# Patient Record
Sex: Female | Born: 1985 | Race: White | Hispanic: No | Marital: Single | Smoking: Current every day smoker
Health system: Southern US, Community
[De-identification: ages and names within clinical notes are randomized; demographics above are authoritative.]

## PROBLEM LIST (undated history)

## (undated) DIAGNOSIS — R011 Cardiac murmur, unspecified: Secondary | ICD-10-CM

## (undated) DIAGNOSIS — F319 Bipolar disorder, unspecified: Secondary | ICD-10-CM

## (undated) DIAGNOSIS — F988 Other specified behavioral and emotional disorders with onset usually occurring in childhood and adolescence: Secondary | ICD-10-CM

## (undated) DIAGNOSIS — F419 Anxiety disorder, unspecified: Secondary | ICD-10-CM

## (undated) HISTORY — PX: MANDIBLE FRACTURE SURGERY: SHX706

## (undated) HISTORY — PX: ABDOMINAL SURGERY: SHX537

---
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
## 2005-03-31 ENCOUNTER — Observation Stay: Payer: Self-pay

## 2005-05-24 ENCOUNTER — Observation Stay: Payer: Self-pay | Admitting: Obstetrics & Gynecology

## 2005-07-10 ENCOUNTER — Inpatient Hospital Stay: Payer: Self-pay

## 2006-01-30 ENCOUNTER — Emergency Department: Payer: Self-pay | Admitting: Emergency Medicine

## 2006-07-01 ENCOUNTER — Emergency Department: Payer: Self-pay | Admitting: Emergency Medicine

## 2007-01-28 ENCOUNTER — Emergency Department: Payer: Self-pay | Admitting: Unknown Physician Specialty

## 2007-05-23 ENCOUNTER — Emergency Department: Payer: Self-pay | Admitting: Emergency Medicine

## 2007-07-14 ENCOUNTER — Inpatient Hospital Stay: Payer: Self-pay | Admitting: Unknown Physician Specialty

## 2007-10-29 ENCOUNTER — Emergency Department: Payer: Self-pay | Admitting: Emergency Medicine

## 2008-09-17 ENCOUNTER — Inpatient Hospital Stay: Payer: Self-pay | Admitting: Psychiatry

## 2008-09-17 ENCOUNTER — Ambulatory Visit: Payer: Self-pay | Admitting: Cardiovascular Disease

## 2009-05-09 ENCOUNTER — Emergency Department: Payer: Self-pay | Admitting: Emergency Medicine

## 2009-08-01 ENCOUNTER — Emergency Department: Payer: Self-pay | Admitting: Emergency Medicine

## 2009-12-26 ENCOUNTER — Ambulatory Visit: Payer: Self-pay | Admitting: Family Medicine

## 2009-12-29 ENCOUNTER — Ambulatory Visit: Payer: Self-pay | Admitting: Internal Medicine

## 2010-02-14 ENCOUNTER — Ambulatory Visit: Payer: Self-pay | Admitting: Internal Medicine

## 2010-04-28 ENCOUNTER — Ambulatory Visit: Payer: Self-pay | Admitting: Internal Medicine

## 2010-10-15 ENCOUNTER — Emergency Department: Payer: Self-pay | Admitting: Emergency Medicine

## 2010-12-11 ENCOUNTER — Ambulatory Visit: Payer: Self-pay | Admitting: Internal Medicine

## 2010-12-17 ENCOUNTER — Emergency Department: Payer: Self-pay | Admitting: Emergency Medicine

## 2010-12-28 ENCOUNTER — Ambulatory Visit: Payer: Self-pay | Admitting: Internal Medicine

## 2010-12-31 ENCOUNTER — Ambulatory Visit: Payer: Self-pay | Admitting: Family Medicine

## 2011-06-07 ENCOUNTER — Ambulatory Visit: Payer: Self-pay | Admitting: Obstetrics & Gynecology

## 2011-06-14 ENCOUNTER — Ambulatory Visit: Payer: Self-pay | Admitting: Obstetrics & Gynecology

## 2013-10-27 ENCOUNTER — Ambulatory Visit: Payer: Self-pay | Admitting: Family Medicine

## 2013-10-27 LAB — RAPID STREP-A WITH REFLX: MICRO TEXT REPORT: NEGATIVE

## 2013-10-30 LAB — BETA STREP CULTURE(ARMC)

## 2013-12-16 ENCOUNTER — Emergency Department: Payer: Self-pay | Admitting: Internal Medicine

## 2013-12-16 LAB — URINALYSIS, COMPLETE
BILIRUBIN, UR: NEGATIVE
Bacteria: NONE SEEN
Blood: NEGATIVE
GLUCOSE, UR: NEGATIVE mg/dL (ref 0–75)
Ketone: NEGATIVE
Leukocyte Esterase: NEGATIVE
NITRITE: NEGATIVE
Ph: 7 (ref 4.5–8.0)
Protein: NEGATIVE
RBC,UR: 1 /HPF (ref 0–5)
Specific Gravity: 1.004 (ref 1.003–1.030)
Squamous Epithelial: 1
WBC UR: 1 /HPF (ref 0–5)

## 2013-12-22 ENCOUNTER — Ambulatory Visit: Payer: Self-pay | Admitting: Family Medicine

## 2014-02-04 ENCOUNTER — Ambulatory Visit: Payer: Self-pay | Admitting: Emergency Medicine

## 2014-02-09 ENCOUNTER — Ambulatory Visit: Payer: Self-pay | Admitting: Family Medicine

## 2014-02-18 ENCOUNTER — Emergency Department: Payer: Self-pay | Admitting: Emergency Medicine

## 2014-03-14 ENCOUNTER — Emergency Department: Payer: Self-pay | Admitting: Emergency Medicine

## 2014-03-14 LAB — URINALYSIS, COMPLETE
BILIRUBIN, UR: NEGATIVE
Glucose,UR: NEGATIVE mg/dL (ref 0–75)
Ketone: NEGATIVE
NITRITE: POSITIVE
PH: 6 (ref 4.5–8.0)
Protein: NEGATIVE
RBC,UR: 2213 /HPF (ref 0–5)
SPECIFIC GRAVITY: 1.009 (ref 1.003–1.030)
Squamous Epithelial: 4

## 2014-04-07 ENCOUNTER — Emergency Department: Payer: Self-pay | Admitting: Internal Medicine

## 2014-04-07 LAB — URINALYSIS, COMPLETE
Bacteria: NONE SEEN
Bilirubin,UR: NEGATIVE
Glucose,UR: NEGATIVE mg/dL (ref 0–75)
Ketone: NEGATIVE
LEUKOCYTE ESTERASE: NEGATIVE
Nitrite: NEGATIVE
Ph: 7 (ref 4.5–8.0)
Protein: NEGATIVE
RBC,UR: 1 /HPF (ref 0–5)
Specific Gravity: 1.005 (ref 1.003–1.030)
Squamous Epithelial: 2
WBC UR: NONE SEEN /HPF (ref 0–5)

## 2014-04-07 LAB — GC/CHLAMYDIA PROBE AMP

## 2014-04-07 LAB — WET PREP, GENITAL

## 2014-04-07 LAB — PREGNANCY, URINE: Pregnancy Test, Urine: NEGATIVE m[IU]/mL

## 2014-05-08 ENCOUNTER — Emergency Department: Payer: Self-pay | Admitting: Emergency Medicine

## 2014-05-08 LAB — CBC WITH DIFFERENTIAL/PLATELET
Basophil #: 0 10*3/uL (ref 0.0–0.1)
Basophil %: 0.9 %
Eosinophil #: 0 10*3/uL (ref 0.0–0.7)
Eosinophil %: 0.7 %
HCT: 46.3 % (ref 35.0–47.0)
HGB: 15.1 g/dL (ref 12.0–16.0)
LYMPHS ABS: 0.9 10*3/uL — AB (ref 1.0–3.6)
LYMPHS PCT: 21.6 %
MCH: 31.1 pg (ref 26.0–34.0)
MCHC: 32.6 g/dL (ref 32.0–36.0)
MCV: 95 fL (ref 80–100)
Monocyte #: 0.4 x10 3/mm (ref 0.2–0.9)
Monocyte %: 9.8 %
Neutrophil #: 2.7 10*3/uL (ref 1.4–6.5)
Neutrophil %: 67 %
Platelet: 153 10*3/uL (ref 150–440)
RBC: 4.85 10*6/uL (ref 3.80–5.20)
RDW: 14.8 % — AB (ref 11.5–14.5)
WBC: 4 10*3/uL (ref 3.6–11.0)

## 2014-05-08 LAB — COMPREHENSIVE METABOLIC PANEL
ALBUMIN: 3.4 g/dL (ref 3.4–5.0)
ANION GAP: 7 (ref 7–16)
Alkaline Phosphatase: 99 U/L
BUN: 9 mg/dL (ref 7–18)
Bilirubin,Total: 0.3 mg/dL (ref 0.2–1.0)
Calcium, Total: 8.7 mg/dL (ref 8.5–10.1)
Chloride: 105 mmol/L (ref 98–107)
Co2: 28 mmol/L (ref 21–32)
Creatinine: 0.95 mg/dL (ref 0.60–1.30)
EGFR (Non-African Amer.): >60
GLUCOSE: 99 mg/dL (ref 65–99)
OSMOLALITY: 278 (ref 275–301)
Potassium: 3.8 mmol/L (ref 3.5–5.1)
SGOT(AST): 34 U/L (ref 15–37)
SGPT (ALT): 35 U/L
SODIUM: 140 mmol/L (ref 136–145)
Total Protein: 7.3 g/dL (ref 6.4–8.2)

## 2014-05-08 LAB — URINALYSIS, COMPLETE
Bacteria: NONE SEEN
Bilirubin,UR: NEGATIVE
Blood: NEGATIVE
Glucose,UR: NEGATIVE mg/dL (ref 0–75)
KETONE: NEGATIVE
Leukocyte Esterase: NEGATIVE
NITRITE: NEGATIVE
Ph: 6 (ref 4.5–8.0)
Protein: NEGATIVE
Specific Gravity: 1.005 (ref 1.003–1.030)
Squamous Epithelial: 3
WBC UR: 3 /HPF (ref 0–5)

## 2014-05-08 LAB — PREGNANCY, URINE: Pregnancy Test, Urine: NEGATIVE m[IU]/mL

## 2014-05-10 LAB — URINE CULTURE

## 2015-03-03 ENCOUNTER — Ambulatory Visit
Admission: EM | Admit: 2015-03-03 | Discharge: 2015-03-03 | Disposition: A | Discharge status: Home / Self Care | Payer: Medicaid Other | Attending: Emergency Medicine | Admitting: Emergency Medicine

## 2015-03-03 DIAGNOSIS — F319 Bipolar disorder, unspecified: Secondary | ICD-10-CM | POA: Insufficient documentation

## 2015-03-03 DIAGNOSIS — R030 Elevated blood-pressure reading, without diagnosis of hypertension: Secondary | ICD-10-CM | POA: Diagnosis not present

## 2015-03-03 DIAGNOSIS — Z79899 Other long term (current) drug therapy: Secondary | ICD-10-CM | POA: Diagnosis not present

## 2015-03-03 DIAGNOSIS — Z87891 Personal history of nicotine dependence: Secondary | ICD-10-CM | POA: Diagnosis not present

## 2015-03-03 DIAGNOSIS — M25512 Pain in left shoulder: Secondary | ICD-10-CM | POA: Diagnosis not present

## 2015-03-03 DIAGNOSIS — F419 Anxiety disorder, unspecified: Secondary | ICD-10-CM | POA: Diagnosis not present

## 2015-03-03 DIAGNOSIS — M542 Cervicalgia: Secondary | ICD-10-CM | POA: Diagnosis not present

## 2015-03-03 DIAGNOSIS — M5412 Radiculopathy, cervical region: Secondary | ICD-10-CM | POA: Diagnosis not present

## 2015-03-03 DIAGNOSIS — M792 Neuralgia and neuritis, unspecified: Secondary | ICD-10-CM | POA: Insufficient documentation

## 2015-03-03 DIAGNOSIS — G8929 Other chronic pain: Secondary | ICD-10-CM

## 2015-03-03 DIAGNOSIS — IMO0001 Reserved for inherently not codable concepts without codable children: Secondary | ICD-10-CM

## 2015-03-03 DIAGNOSIS — I10 Essential (primary) hypertension: Secondary | ICD-10-CM | POA: Diagnosis not present

## 2015-03-03 HISTORY — DX: Bipolar disorder, unspecified: F31.9

## 2015-03-03 HISTORY — DX: Anxiety disorder, unspecified: F41.9

## 2015-03-03 HISTORY — DX: Other specified behavioral and emotional disorders with onset usually occurring in childhood and adolescence: F98.8

## 2015-03-03 LAB — URINE DRUG SCREEN, QUALITATIVE (ARMC ONLY)
Amphetamines, Ur Screen: POSITIVE — AB
Barbiturates, Ur Screen: NOT DETECTED
Benzodiazepine, Ur Scrn: NOT DETECTED
Cannabinoid 50 Ng, Ur ~~LOC~~: POSITIVE — AB
Cocaine Metabolite,Ur ~~LOC~~: NOT DETECTED
MDMA (Ecstasy)Ur Screen: NOT DETECTED
Methadone Scn, Ur: NOT DETECTED
Opiate, Ur Screen: NOT DETECTED
Phencyclidine (PCP) Ur S: NOT DETECTED
Tricyclic, Ur Screen: NOT DETECTED

## 2015-03-03 LAB — PREGNANCY, URINE: Preg Test, Ur: NEGATIVE

## 2015-03-03 MED ORDER — NAPROXEN 500 MG PO TABS: 500.0000 mg | ORAL_TABLET | Freq: Two times a day (BID) | ORAL | Status: DC

## 2015-03-03 MED ORDER — METHOCARBAMOL 500 MG PO TABS
500.0000 mg | ORAL_TABLET | Freq: Two times a day (BID) | ORAL | Status: DC
Start: 2015-03-03 — End: 2016-04-25

## 2015-03-03 MED ORDER — METHYLPREDNISOLONE SODIUM SUCC 125 MG IJ SOLR: 80.0000 mg | Freq: Once | INTRAMUSCULAR | Status: DC

## 2015-03-03 MED ORDER — KETOROLAC TROMETHAMINE 60 MG/2ML IM SOLN: 60.0000 mg | Freq: Once | INTRAMUSCULAR | Status: DC
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
## 2015-03-03 NOTE — Discharge Instructions (Signed)
Please follow up with your PCP, Welton Flakes, or [facility] for further management of chronic pain,neuralgia-call for appt. Return as needed.

## 2015-07-21 ENCOUNTER — Emergency Department
Admission: EM | Admit: 2015-07-21 | Discharge: 2015-07-21 | Disposition: A | Discharge status: Home / Self Care | Payer: Medicaid Other | Attending: Emergency Medicine | Admitting: Emergency Medicine

## 2015-07-21 DIAGNOSIS — Z791 Long term (current) use of non-steroidal anti-inflammatories (NSAID): Secondary | ICD-10-CM | POA: Diagnosis not present

## 2015-07-21 DIAGNOSIS — J40 Bronchitis, not specified as acute or chronic: Secondary | ICD-10-CM

## 2015-07-21 DIAGNOSIS — Z79899 Other long term (current) drug therapy: Secondary | ICD-10-CM | POA: Insufficient documentation

## 2015-07-21 DIAGNOSIS — M79672 Pain in left foot: Secondary | ICD-10-CM | POA: Insufficient documentation

## 2015-07-21 DIAGNOSIS — Z87891 Personal history of nicotine dependence: Secondary | ICD-10-CM | POA: Diagnosis not present

## 2015-07-21 DIAGNOSIS — J209 Acute bronchitis, unspecified: Secondary | ICD-10-CM | POA: Diagnosis not present

## 2015-07-21 MED ORDER — FLUCONAZOLE 100 MG PO TABS
100.0000 mg | ORAL_TABLET | Freq: Every day | ORAL | Status: AC
Start: 2015-07-21 — End: 2015-08-20

## 2015-07-21 MED ORDER — AMOXICILLIN 500 MG PO TABS
500.0000 mg | ORAL_TABLET | Freq: Three times a day (TID) | ORAL | Status: DC
Start: 2015-07-21 — End: 2016-04-25

## 2015-07-21 MED ORDER — ALBUTEROL SULFATE HFA 108 (90 BASE) MCG/ACT IN AERS: 2.0000 | INHALATION_SPRAY | Freq: Four times a day (QID) | RESPIRATORY_TRACT | Status: DC | PRN

## 2015-07-21 MED ORDER — PREDNISONE 10 MG (21) PO TBPK: ORAL_TABLET | ORAL | Status: DC

## 2015-07-21 MED ORDER — AMOXICILLIN 500 MG PO CAPS
500.0000 mg | ORAL_CAPSULE | Freq: Once | ORAL | Status: AC
  Administered 2015-07-21: 500 mg via ORAL
  Filled 2015-07-21: qty 1

## 2015-07-21 MED ORDER — HYDROCODONE-ACETAMINOPHEN 5-325 MG PO TABS: 1.0000 | ORAL_TABLET | ORAL | Status: DC | PRN
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
## 2015-07-21 NOTE — ED Notes (Signed)
Pt c/o left heel pain, "knot on heel" x 2 months. Pt states, "It hurts really bad and I also have an upper respiratory infection for 1.5 months." Pt AAOx4. NAD noted. RR even and nonlabored. Pt c/o cough. Denies fever.

## 2015-07-21 NOTE — Discharge Instructions (Signed)
Upper Respiratory Infection, Adult Most upper respiratory infections (URIs) are a viral infection of the air passages leading to the lungs. A URI affects the nose, throat, and upper air passages. The most common type of URI is nasopharyngitis and is typically referred to as "the common cold." URIs run their course and usually go away on their own. Most of the time, a URI does not require medical attention, but sometimes a bacterial infection in the upper airways can follow a viral infection. This is called a secondary infection. Sinus and middle ear infections are common types of secondary upper respiratory infections. Bacterial pneumonia can also complicate a URI. A URI can worsen asthma and chronic obstructive pulmonary disease (COPD). Sometimes, these complications can require emergency medical care and may be life threatening.  CAUSES Almost all URIs are caused by viruses. A virus is a type of germ and can spread from one person to another.  RISKS FACTORS You may be at risk for a URI if:   You smoke.   You have chronic heart or lung disease.  You have a weakened defense (immune) system.   You are very young or very old.   You have nasal allergies or asthma.  You work in crowded or poorly ventilated areas.  You work in health care facilities or schools. SIGNS AND SYMPTOMS  Symptoms typically develop 2-3 days after you come in contact with a cold virus. Most viral URIs last 7-10 days. However, viral URIs from the influenza virus (flu virus) can last 14-18 days and are typically more severe. Symptoms may include:   Runny or stuffy (congested) nose.   Sneezing.   Cough.   Sore throat.   Headache.   Fatigue.   Fever.   Loss of appetite.   Pain in your forehead, behind your eyes, and over your cheekbones (sinus pain).  Muscle aches.  DIAGNOSIS  Your health care provider may diagnose a URI by:  Physical exam.  Tests to check that your symptoms are not due to  another condition such as:  Strep throat.  Sinusitis.  Pneumonia.  Asthma. TREATMENT  A URI goes away on its own with time. It cannot be cured with medicines, but medicines may be prescribed or recommended to relieve symptoms. Medicines may help:  Reduce your fever.  Reduce your cough.  Relieve nasal congestion. HOME CARE INSTRUCTIONS   Take medicines only as directed by your health care provider.   Gargle warm saltwater or take cough drops to comfort your throat as directed by your health care provider.  Use a warm mist humidifier or inhale steam from a shower to increase air moisture. This may make it easier to breathe.  Drink enough fluid to keep your urine clear or pale yellow.   Eat soups and other clear broths and maintain good nutrition.   Rest as needed.   Return to work when your temperature has returned to normal or as your health care provider advises. You may need to stay home longer to avoid infecting others. You can also use a face mask and careful hand washing to prevent spread of the virus.  Increase the usage of your inhaler if you have asthma.   Do not use any tobacco products, including cigarettes, chewing tobacco, or electronic cigarettes. If you need help quitting, ask your health care provider. PREVENTION  The best way to protect yourself from getting a cold is to practice good hygiene.   Avoid oral or hand contact with people with cold  symptoms.   Wash your hands often if contact occurs.  There is no clear evidence that vitamin C, vitamin E, echinacea, or exercise reduces the chance of developing a cold. However, it is always recommended to get plenty of rest, exercise, and practice good nutrition.  SEEK MEDICAL CARE IF:   You are getting worse rather than better.   Your symptoms are not controlled by medicine.   You have chills.  You have worsening shortness of breath.  You have brown or red mucus.  You have yellow or brown nasal  discharge.  You have pain in your face, especially when you bend forward.  You have a fever.  You have swollen neck glands.  You have pain while swallowing.  You have white areas in the back of your throat. SEEK IMMEDIATE MEDICAL CARE IF:   You have severe or persistent:  Headache.  Ear pain.  Sinus pain.  Chest pain.  You have chronic lung disease and any of the following:  Wheezing.  Prolonged cough.  Coughing up blood.  A change in your usual mucus.  You have a stiff neck.  You have changes in your:  Vision.  Hearing.  Thinking.  Mood. MAKE SURE YOU:   Understand these instructions.  Will watch your condition.  Will get help right away if you are not doing well or get worse.   This information is not intended to replace advice given to you by your health care provider. Make sure you discuss any questions you have with your health care provider.   Document Released: 02/27/2001 Document Revised: 01/18/2015 Document Reviewed: 12/09/2013 Elsevier Interactive Patient Education 2016 [employer]  Take pain medicine and antibiotic as directed. Prednisone should help the wheezing. Follow-up with the foot doctor, Dr. Al CorpusHyatt, when you are able. Return to the emergency room for any concerns.

## 2015-07-21 NOTE — ED Notes (Signed)
ER provider at bedside for eval. 

## 2015-07-31 ENCOUNTER — Emergency Department
Admission: EM | Admit: 2015-07-31 | Discharge: 2015-07-31 | Disposition: A | Discharge status: Home / Self Care | Payer: Medicaid Other | Attending: Emergency Medicine | Admitting: Emergency Medicine

## 2015-07-31 ENCOUNTER — Encounter: Payer: Self-pay | Admitting: Emergency Medicine

## 2015-07-31 DIAGNOSIS — F17210 Nicotine dependence, cigarettes, uncomplicated: Secondary | ICD-10-CM | POA: Diagnosis not present

## 2015-07-31 DIAGNOSIS — L989 Disorder of the skin and subcutaneous tissue, unspecified: Secondary | ICD-10-CM | POA: Diagnosis not present

## 2015-07-31 DIAGNOSIS — R05 Cough: Secondary | ICD-10-CM | POA: Diagnosis present

## 2015-07-31 DIAGNOSIS — J069 Acute upper respiratory infection, unspecified: Secondary | ICD-10-CM | POA: Diagnosis not present

## 2015-07-31 MED ORDER — HYDROCOD POLST-CPM POLST ER 10-8 MG/5ML PO SUER: 5.0000 mL | Freq: Two times a day (BID) | ORAL | Status: DC

## 2015-07-31 MED ORDER — HYDROCOD POLST-CPM POLST ER 10-8 MG/5ML PO SUER
5.0000 mL | Freq: Once | ORAL | Status: AC
  Administered 2015-07-31: 5 mL via ORAL
  Filled 2015-07-31: qty 5
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
## 2015-07-31 NOTE — ED Notes (Signed)
Pt states she has had cough and congestion for a couple of weeks, started antibiotic and prednisone about 1 week ago but states she feels like she is not getting any better

## 2015-07-31 NOTE — Discharge Instructions (Signed)
Upper Respiratory Infection, Adult Most upper respiratory infections (URIs) are a viral infection of the air passages leading to the lungs. A URI affects the nose, throat, and upper air passages. The most common type of URI is nasopharyngitis and is typically referred to as "the common cold." URIs run their course and usually go away on their own. Most of the time, a URI does not require medical attention, but sometimes a bacterial infection in the upper airways can follow a viral infection. This is called a secondary infection. Sinus and middle ear infections are common types of secondary upper respiratory infections. Bacterial pneumonia can also complicate a URI. A URI can worsen asthma and chronic obstructive pulmonary disease (COPD). Sometimes, these complications can require emergency medical care and may be life threatening.  CAUSES Almost all URIs are caused by viruses. A virus is a type of germ and can spread from one person to another.  RISKS FACTORS You may be at risk for a URI if:   You smoke.   You have chronic heart or lung disease.  You have a weakened defense (immune) system.   You are very young or very old.   You have nasal allergies or asthma.  You work in crowded or poorly ventilated areas.  You work in health care facilities or schools. SIGNS AND SYMPTOMS  Symptoms typically develop 2-3 days after you come in contact with a cold virus. Most viral URIs last 7-10 days. However, viral URIs from the influenza virus (flu virus) can last 14-18 days and are typically more severe. Symptoms may include:   Runny or stuffy (congested) nose.   Sneezing.   Cough.   Sore throat.   Headache.   Fatigue.   Fever.   Loss of appetite.   Pain in your forehead, behind your eyes, and over your cheekbones (sinus pain).  Muscle aches.  DIAGNOSIS  Your health care provider may diagnose a URI by:  Physical exam.  Tests to check that your symptoms are not due to  another condition such as:  Strep throat.  Sinusitis.  Pneumonia.  Asthma. TREATMENT  A URI goes away on its own with time. It cannot be cured with medicines, but medicines may be prescribed or recommended to relieve symptoms. Medicines may help:  Reduce your fever.  Reduce your cough.  Relieve nasal congestion. HOME CARE INSTRUCTIONS   Take medicines only as directed by your health care provider.   Gargle warm saltwater or take cough drops to comfort your throat as directed by your health care provider.  Use a warm mist humidifier or inhale steam from a shower to increase air moisture. This may make it easier to breathe.  Drink enough fluid to keep your urine clear or pale yellow.   Eat soups and other clear broths and maintain good nutrition.   Rest as needed.   Return to work when your temperature has returned to normal or as your health care provider advises. You may need to stay home longer to avoid infecting others. You can also use a face mask and careful hand washing to prevent spread of the virus.  Increase the usage of your inhaler if you have asthma.   Do not use any tobacco products, including cigarettes, chewing tobacco, or electronic cigarettes. If you need help quitting, ask your health care provider. PREVENTION  The best way to protect yourself from getting a cold is to practice good hygiene.   Avoid oral or hand contact with people with cold   symptoms.   Wash your hands often if contact occurs.  There is no clear evidence that vitamin C, vitamin E, echinacea, or exercise reduces the chance of developing a cold. However, it is always recommended to get plenty of rest, exercise, and practice good nutrition.  SEEK MEDICAL CARE IF:   You are getting worse rather than better.   Your symptoms are not controlled by medicine.   You have chills.  You have worsening shortness of breath.  You have brown or red mucus.  You have yellow or brown nasal  discharge.  You have pain in your face, especially when you bend forward.  You have a fever.  You have swollen neck glands.  You have pain while swallowing.  You have white areas in the back of your throat. SEEK IMMEDIATE MEDICAL CARE IF:   You have severe or persistent:  Headache.  Ear pain.  Sinus pain.  Chest pain.  You have chronic lung disease and any of the following:  Wheezing.  Prolonged cough.  Coughing up blood.  A change in your usual mucus.  You have a stiff neck.  You have changes in your:  Vision.  Hearing.  Thinking.  Mood. MAKE SURE YOU:   Understand these instructions.  Will watch your condition.  Will get help right away if you are not doing well or get worse.   This information is not intended to replace advice given to you by your health care provider. Make sure you discuss any questions you have with your health care provider.   Document Released: 02/27/2001 Document Revised: 01/18/2015 Document Reviewed: 12/09/2013 Elsevier Interactive Patient Education 2016 [employer]  

## 2015-12-18 ENCOUNTER — Emergency Department
Admission: EM | Admit: 2015-12-18 | Discharge: 2015-12-18 | Discharge status: Against Medical Advice | Payer: Medicaid Other | Attending: Emergency Medicine | Admitting: Emergency Medicine

## 2015-12-18 DIAGNOSIS — Z5321 Procedure and treatment not carried out due to patient leaving prior to being seen by health care provider: Secondary | ICD-10-CM | POA: Diagnosis not present

## 2015-12-18 NOTE — ED Notes (Signed)
Pt reports irreg menses, lmp feb 24th. Pt states that she has taken multiple pregnancy tests at home that were negative, pt requesting blood sample

## 2015-12-18 NOTE — ED Notes (Signed)
Pt not in treatment room.

## 2015-12-20 ENCOUNTER — Telehealth: Payer: Self-pay | Admitting: Emergency Medicine

## 2015-12-20 NOTE — ED Notes (Signed)
Called patient due to lwot to inquire about condition and follow up plans. Left message.   

## 2016-04-25 ENCOUNTER — Ambulatory Visit
Admission: EM | Admit: 2016-04-25 | Discharge: 2016-04-25 | Disposition: A | Discharge status: Home / Self Care | Payer: Medicaid Other

## 2016-04-25 DIAGNOSIS — K047 Periapical abscess without sinus: Secondary | ICD-10-CM | POA: Diagnosis not present

## 2016-04-25 MED ORDER — AMOXICILLIN-POT CLAVULANATE 875-125 MG PO TABS: 1.0000 | ORAL_TABLET | Freq: Two times a day (BID) | ORAL | 0 refills | Status: AC

## 2016-04-25 MED ORDER — FLUCONAZOLE 150 MG PO TABS
150.0000 mg | ORAL_TABLET | Freq: Every day | ORAL | 0 refills | Status: AC
Start: 2016-04-25 — End: 2016-04-26

## 2016-04-25 MED ORDER — OXYCODONE-ACETAMINOPHEN 5-325 MG PO TABS: 1.0000 | ORAL_TABLET | Freq: Four times a day (QID) | ORAL | 0 refills | Status: DC | PRN

## 2016-04-25 NOTE — ED Triage Notes (Signed)
Patient had her wisdom teeth taken out in February, however today she complains of an abscess on her right top jaw.

## 2016-04-25 NOTE — Discharge Instructions (Signed)
Start Augmentin as directed. Take Diflucan 1 dose with antibiotic. May repeat in 4 days if needed. Use Percocet as needed for pain.
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
## 2016-06-12 ENCOUNTER — Ambulatory Visit
Admission: EM | Admit: 2016-06-12 | Discharge: 2016-06-12 | Discharge status: Against Medical Advice | Payer: Medicaid Other | Attending: Family Medicine | Admitting: Family Medicine

## 2016-06-12 DIAGNOSIS — K5900 Constipation, unspecified: Secondary | ICD-10-CM | POA: Diagnosis not present

## 2016-06-12 MED ORDER — FLEET ENEMA 7-19 GM/118ML RE ENEM: 1.0000 | ENEMA | Freq: Every day | RECTAL | 0 refills | Status: AC | PRN

## 2016-06-12 MED ORDER — POLYETHYLENE GLYCOL 3350 17 G PO PACK: 17.0000 g | PACK | Freq: Every day | ORAL | 0 refills | Status: AC

## 2016-06-12 NOTE — ED Triage Notes (Signed)
Patient complains of constipation and states that she needs to be disimpacted. Patient states that she has tried nothing OTC to help.
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
## 2016-06-16 ENCOUNTER — Telehealth: Payer: Self-pay

## 2016-06-16 NOTE — Telephone Encounter (Signed)
Courtesy call back completed today for patient's recent visit at [facility]. Patient did not answer, left message on machine to call back with any questions or concerns.   

## 2016-07-09 ENCOUNTER — Emergency Department
Admission: EM | Admit: 2016-07-09 | Discharge: 2016-07-10 | Disposition: A | Discharge status: Home / Self Care | Payer: Medicaid Other | Attending: Emergency Medicine | Admitting: Emergency Medicine

## 2016-07-09 ENCOUNTER — Emergency Department: Payer: Medicaid Other

## 2016-07-09 ENCOUNTER — Encounter: Payer: Self-pay | Admitting: *Deleted

## 2016-07-09 DIAGNOSIS — F909 Attention-deficit hyperactivity disorder, unspecified type: Secondary | ICD-10-CM | POA: Insufficient documentation

## 2016-07-09 DIAGNOSIS — R112 Nausea with vomiting, unspecified: Secondary | ICD-10-CM

## 2016-07-09 DIAGNOSIS — R1031 Right lower quadrant pain: Secondary | ICD-10-CM

## 2016-07-09 DIAGNOSIS — B9689 Other specified bacterial agents as the cause of diseases classified elsewhere: Secondary | ICD-10-CM

## 2016-07-09 DIAGNOSIS — N83201 Unspecified ovarian cyst, right side: Secondary | ICD-10-CM | POA: Diagnosis not present

## 2016-07-09 DIAGNOSIS — F17210 Nicotine dependence, cigarettes, uncomplicated: Secondary | ICD-10-CM | POA: Diagnosis not present

## 2016-07-09 DIAGNOSIS — R1030 Lower abdominal pain, unspecified: Secondary | ICD-10-CM

## 2016-07-09 DIAGNOSIS — Z79899 Other long term (current) drug therapy: Secondary | ICD-10-CM | POA: Diagnosis not present

## 2016-07-09 DIAGNOSIS — R197 Diarrhea, unspecified: Secondary | ICD-10-CM

## 2016-07-09 DIAGNOSIS — N760 Acute vaginitis: Secondary | ICD-10-CM | POA: Diagnosis not present

## 2016-07-09 DIAGNOSIS — R102 Pelvic and perineal pain: Secondary | ICD-10-CM | POA: Diagnosis present

## 2016-07-09 LAB — COMPREHENSIVE METABOLIC PANEL
ALBUMIN: 4.1 g/dL (ref 3.5–5.0)
ALK PHOS: 72 U/L (ref 38–126)
ALT: 21 U/L (ref 14–54)
AST: 33 U/L (ref 15–41)
Anion gap: 8 (ref 5–15)
BILIRUBIN TOTAL: 0.6 mg/dL (ref 0.3–1.2)
BUN: 8 mg/dL (ref 6–20)
CO2: 22 mmol/L (ref 22–32)
Calcium: 9.2 mg/dL (ref 8.9–10.3)
Chloride: 107 mmol/L (ref 101–111)
Creatinine, Ser: 0.74 mg/dL (ref 0.44–1.00)
GFR calc Af Amer: >60 mL/min (ref >60)
GFR calc non Af Amer: >60 mL/min (ref >60)
GLUCOSE: 113 mg/dL — AB (ref 65–99)
POTASSIUM: 3.9 mmol/L (ref 3.5–5.1)
SODIUM: 137 mmol/L (ref 135–145)
Total Protein: 7.1 g/dL (ref 6.5–8.1)

## 2016-07-09 LAB — URINALYSIS COMPLETE WITH MICROSCOPIC (ARMC ONLY)
Bacteria, UA: NONE SEEN
Bilirubin Urine: NEGATIVE
GLUCOSE, UA: NEGATIVE mg/dL
Hgb urine dipstick: NEGATIVE
Ketones, ur: NEGATIVE mg/dL
Leukocytes, UA: NEGATIVE
Nitrite: NEGATIVE
Protein, ur: NEGATIVE mg/dL
SPECIFIC GRAVITY, URINE: 1.009 (ref 1.005–1.030)
pH: 5 (ref 5.0–8.0)

## 2016-07-09 LAB — LIPASE, BLOOD: Lipase: 20 U/L (ref 11–51)

## 2016-07-09 LAB — CHLAMYDIA/NGC RT PCR (ARMC ONLY)
CHLAMYDIA TR: NOT DETECTED
N gonorrhoeae: NOT DETECTED

## 2016-07-09 LAB — POCT PREGNANCY, URINE: PREG TEST UR: NEGATIVE

## 2016-07-09 LAB — WET PREP, GENITAL
Sperm: NONE SEEN
TRICH WET PREP: NONE SEEN
YEAST WET PREP: NONE SEEN

## 2016-07-09 LAB — CBC
HCT: 42.3 % (ref 35.0–47.0)
Hemoglobin: 14 g/dL (ref 12.0–16.0)
MCH: 30.4 pg (ref 26.0–34.0)
MCHC: 33.1 g/dL (ref 32.0–36.0)
MCV: 91.8 fL (ref 80.0–100.0)
Platelets: 318 10*3/uL (ref 150–440)
RBC: 4.61 MIL/uL (ref 3.80–5.20)
RDW: 14.6 % — AB (ref 11.5–14.5)
WBC: 13.5 10*3/uL — ABNORMAL HIGH (ref 3.6–11.0)

## 2016-07-09 MED ORDER — ONDANSETRON HCL 4 MG/2ML IJ SOLN
4.0000 mg | Freq: Once | INTRAMUSCULAR | Status: AC
  Administered 2016-07-09: 4 mg via INTRAVENOUS
  Filled 2016-07-09: qty 2

## 2016-07-09 MED ORDER — SODIUM CHLORIDE 0.9 % IV BOLUS (SEPSIS)
1000.0000 mL | Freq: Once | INTRAVENOUS | Status: AC
  Administered 2016-07-09: 1000 mL via INTRAVENOUS

## 2016-07-09 MED ORDER — KETOROLAC TROMETHAMINE 30 MG/ML IJ SOLN
30.0000 mg | Freq: Once | INTRAMUSCULAR | Status: AC
  Administered 2016-07-09: 30 mg via INTRAVENOUS
  Filled 2016-07-09: qty 1

## 2016-07-09 NOTE — ED Notes (Signed)
Pt ambulatory to toilet with no assistance.  
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
## 2016-07-09 NOTE — ED Notes (Signed)
Dr. Veronese at bedside 

## 2016-07-09 NOTE — ED Notes (Signed)
Pt returned from [facility] via stretcher. 
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
## 2016-07-09 NOTE — ED Triage Notes (Signed)
Pt has right lower abd pain.  Pt reports n/v/d since yesterday.  Pt also reports dysuria.  No vag bleeding or vag discharge.  Pt alert.

## 2016-07-10 MED ORDER — OXYCODONE-ACETAMINOPHEN 5-325 MG PO TABS: 1.0000 | ORAL_TABLET | ORAL | 0 refills | Status: AC | PRN

## 2016-07-10 MED ORDER — OXYCODONE-ACETAMINOPHEN 5-325 MG PO TABS
1.0000 | ORAL_TABLET | Freq: Once | ORAL | Status: AC
  Administered 2016-07-10: 1 via ORAL
  Filled 2016-07-10: qty 1

## 2016-07-10 MED ORDER — METRONIDAZOLE 500 MG PO TABS: 500.0000 mg | ORAL_TABLET | Freq: Two times a day (BID) | ORAL | 0 refills | Status: AC

## 2016-07-10 MED ORDER — ONDANSETRON 4 MG PO TBDP
4.0000 mg | ORAL_TABLET | Freq: Once | ORAL | Status: AC
  Administered 2016-07-10: 4 mg via ORAL
  Filled 2016-07-10: qty 1

## 2016-07-10 MED ORDER — IBUPROFEN 800 MG PO TABS: 800.0000 mg | ORAL_TABLET | Freq: Three times a day (TID) | ORAL | 0 refills | Status: AC | PRN

## 2016-07-10 MED ORDER — ONDANSETRON 4 MG PO TBDP: 4.0000 mg | ORAL_TABLET | Freq: Three times a day (TID) | ORAL | 0 refills | Status: AC | PRN

## 2016-07-10 NOTE — Discharge Instructions (Signed)
1. Take antibiotic as prescribed (Flagyl 500 mg twice daily 7 days). 2. You may take medicines as needed for pain and nausea (Motrin/Percocet/Zofran). 3. Return to the ER for worsening symptoms, persistent vomiting, difficulty breathing or other concerns.

## 2016-07-10 NOTE — ED Notes (Signed)
Dr. Sung at bedside.  

## 2016-07-10 NOTE — ED Notes (Signed)
Pt ambulatory to toilet with no assitance and steady gait noted.

## 2016-07-10 NOTE — ED Provider Notes (Signed)
-----------------------------------------   12:22 AM on 07/10/2016 -----------------------------------------  Pelvic ultrasound interpreted per Dr. Phill MyronMcClintock: 1. No acute abnormality identified within the pelvis.  2. 2 cysts within the right ovary measuring up to 1.9 cm as above,  likely normal physiologic cysts.  3. Normal sonographic appearance of the uterus and left ovary.  4. No sonographic evidence for ovarian torsion.   Updated patient on ultrasound results. Reexamined patient who does not have pain at McBurney's point. Rather, she is mildly tender to palpation in the right pelvis. We discussed whether or not to proceed with CT abdomen/pelvis. Feel appendicitis unlikely given the length and duration of patient's symptoms without fever. Patient would like to be discharged with pain and nausea medicines and understand she is to return to the ER at once if she worsens. Strict return precautions given. Patient verbalizes understanding and agrees with plan of care.   Irean HongJade J Parminder Cupples, MD 07/10/16 [phone]

## 2016-10-04 ENCOUNTER — Emergency Department
Admission: EM | Admit: 2016-10-04 | Discharge: 2016-10-04 | Disposition: A | Discharge status: Home / Self Care | Payer: Medicaid Other | Attending: Emergency Medicine | Admitting: Emergency Medicine

## 2016-10-04 ENCOUNTER — Encounter: Payer: Self-pay | Admitting: Emergency Medicine

## 2016-10-04 ENCOUNTER — Emergency Department: Payer: Medicaid Other

## 2016-10-04 DIAGNOSIS — F17210 Nicotine dependence, cigarettes, uncomplicated: Secondary | ICD-10-CM | POA: Insufficient documentation

## 2016-10-04 DIAGNOSIS — Z791 Long term (current) use of non-steroidal anti-inflammatories (NSAID): Secondary | ICD-10-CM | POA: Diagnosis not present

## 2016-10-04 DIAGNOSIS — Z79899 Other long term (current) drug therapy: Secondary | ICD-10-CM | POA: Diagnosis not present

## 2016-10-04 DIAGNOSIS — R109 Unspecified abdominal pain: Secondary | ICD-10-CM

## 2016-10-04 DIAGNOSIS — K529 Noninfective gastroenteritis and colitis, unspecified: Secondary | ICD-10-CM | POA: Diagnosis not present

## 2016-10-04 LAB — URINALYSIS, COMPLETE (UACMP) WITH MICROSCOPIC
BACTERIA UA: NONE SEEN
Bilirubin Urine: NEGATIVE
Glucose, UA: NEGATIVE mg/dL
Hgb urine dipstick: NEGATIVE
Ketones, ur: NEGATIVE mg/dL
Leukocytes, UA: NEGATIVE
Nitrite: NEGATIVE
PROTEIN: 30 mg/dL — AB
SPECIFIC GRAVITY, URINE: 1.019 (ref 1.005–1.030)
pH: 8 (ref 5.0–8.0)

## 2016-10-04 LAB — CBC
HEMATOCRIT: 47.8 % — AB (ref 35.0–47.0)
Hemoglobin: 16.1 g/dL — ABNORMAL HIGH (ref 12.0–16.0)
MCH: 29.9 pg (ref 26.0–34.0)
MCHC: 33.7 g/dL (ref 32.0–36.0)
MCV: 88.9 fL (ref 80.0–100.0)
PLATELETS: 383 10*3/uL (ref 150–440)
RBC: 5.38 MIL/uL — ABNORMAL HIGH (ref 3.80–5.20)
RDW: 14.1 % (ref 11.5–14.5)
WBC: 15.9 10*3/uL — ABNORMAL HIGH (ref 3.6–11.0)

## 2016-10-04 LAB — COMPREHENSIVE METABOLIC PANEL
ALT: 39 U/L (ref 14–54)
AST: 30 U/L (ref 15–41)
Albumin: 5 g/dL (ref 3.5–5.0)
Alkaline Phosphatase: 93 U/L (ref 38–126)
Anion gap: 8 (ref 5–15)
BILIRUBIN TOTAL: 0.5 mg/dL (ref 0.3–1.2)
BUN: 11 mg/dL (ref 6–20)
CO2: 28 mmol/L (ref 22–32)
Calcium: 10 mg/dL (ref 8.9–10.3)
Chloride: 100 mmol/L — ABNORMAL LOW (ref 101–111)
Creatinine, Ser: 0.73 mg/dL (ref 0.44–1.00)
GFR calc Af Amer: >60 mL/min (ref >60)
GFR calc non Af Amer: >60 mL/min (ref >60)
GLUCOSE: 98 mg/dL (ref 65–99)
POTASSIUM: 4.1 mmol/L (ref 3.5–5.1)
Sodium: 136 mmol/L (ref 135–145)
TOTAL PROTEIN: 8.8 g/dL — AB (ref 6.5–8.1)

## 2016-10-04 LAB — LIPASE, BLOOD: Lipase: 27 U/L (ref 11–51)

## 2016-10-04 LAB — LITHIUM LEVEL

## 2016-10-04 LAB — POCT PREGNANCY, URINE: PREG TEST UR: NEGATIVE

## 2016-10-04 MED ORDER — ONDANSETRON HCL 4 MG/2ML IJ SOLN
4.0000 mg | Freq: Once | INTRAMUSCULAR | Status: AC
  Administered 2016-10-04: 4 mg via INTRAVENOUS
  Filled 2016-10-04: qty 2

## 2016-10-04 MED ORDER — SODIUM CHLORIDE 0.9 % IV SOLN
Freq: Once | INTRAVENOUS | Status: AC
  Administered 2016-10-04: 20:00:00 via INTRAVENOUS

## 2016-10-04 MED ORDER — TRAZODONE HCL 50 MG PO TABS
50.0000 mg | ORAL_TABLET | Freq: Every day | ORAL | Status: DC
  Administered 2016-10-04: 50 mg via ORAL
  Filled 2016-10-04: qty 1

## 2016-10-04 MED ORDER — IOPAMIDOL (ISOVUE-300) INJECTION 61%
100.0000 mL | Freq: Once | INTRAVENOUS | Status: AC | PRN
  Administered 2016-10-04: 100 mL via INTRAVENOUS

## 2016-10-04 MED ORDER — IOPAMIDOL (ISOVUE-370) INJECTION 76%: 100.0000 mL | Freq: Once | INTRAVENOUS | Status: DC | PRN

## 2016-10-04 MED ORDER — IOPAMIDOL (ISOVUE-300) INJECTION 61%
30.0000 mL | Freq: Once | INTRAVENOUS | Status: AC
  Administered 2016-10-04: 30 mL via ORAL

## 2016-10-04 NOTE — ED Notes (Signed)
ED Provider at bedside. 

## 2016-10-04 NOTE — ED Notes (Signed)
CT called and informed that the patient has finished her contrast.

## 2016-10-04 NOTE — ED Triage Notes (Addendum)
Pt c/o lower abdominal cramping and vomiting for 1 week as well as diarrhea. Also reports bipolar and has been on and off meds. On lithium and seroquel but has has them so not been sleeping per pt. Denies SI/HI. Pt reports she was awake but dreaming last night and could see/hear things of her dream but was awake. Reports "woke up" and was shaking and couldn't stop for 1 hr.

## 2016-10-04 NOTE — Discharge Instructions (Signed)
Please use the Zofran 1 waffer 3 times a day melt on your tongue as needed for nausea. You can use Pepto-Bismol for the diarrhea. I will give you some trazodone 1 before bed to help with sleep. Please return here for worse pain, fever unable to keep down fluids or feeling sicker or if you have any other problems that you think you might need a psychiatrist for. I will also give you the sheet for [facility]. You can walk in to a psychiatrist if you need to before you se e yours on Tuesday.
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
## 2016-12-11 ENCOUNTER — Encounter: Payer: Self-pay | Admitting: *Deleted

## 2016-12-11 ENCOUNTER — Ambulatory Visit
Admission: EM | Admit: 2016-12-11 | Discharge: 2016-12-11 | Disposition: A | Discharge status: Home / Self Care | Payer: Medicaid Other | Attending: Family Medicine | Admitting: Family Medicine

## 2016-12-11 DIAGNOSIS — H9203 Otalgia, bilateral: Secondary | ICD-10-CM

## 2016-12-11 DIAGNOSIS — H6993 Unspecified Eustachian tube disorder, bilateral: Secondary | ICD-10-CM | POA: Diagnosis not present

## 2016-12-11 MED ORDER — NAPROXEN 500 MG PO TABS: 500.0000 mg | ORAL_TABLET | Freq: Two times a day (BID) | ORAL | 0 refills | Status: AC

## 2016-12-11 MED ORDER — FLUTICASONE PROPIONATE 50 MCG/ACT NA SUSP: 2.0000 | Freq: Every day | NASAL | 0 refills | Status: AC

## 2016-12-11 NOTE — ED Triage Notes (Signed)
Patient started having bilateral ear pain and reduced hearing this am.
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
## 2017-01-20 ENCOUNTER — Encounter: Payer: Self-pay | Admitting: Emergency Medicine

## 2017-01-20 ENCOUNTER — Emergency Department: Payer: Medicaid Other

## 2017-01-20 ENCOUNTER — Emergency Department
Admission: EM | Admit: 2017-01-20 | Discharge: 2017-01-21 | Disposition: A | Discharge status: Home / Self Care | Payer: Medicaid Other | Attending: Emergency Medicine | Admitting: Emergency Medicine

## 2017-01-20 DIAGNOSIS — F17210 Nicotine dependence, cigarettes, uncomplicated: Secondary | ICD-10-CM | POA: Insufficient documentation

## 2017-01-20 DIAGNOSIS — F419 Anxiety disorder, unspecified: Secondary | ICD-10-CM

## 2017-01-20 DIAGNOSIS — R0602 Shortness of breath: Secondary | ICD-10-CM | POA: Insufficient documentation

## 2017-01-20 DIAGNOSIS — R079 Chest pain, unspecified: Secondary | ICD-10-CM

## 2017-01-20 HISTORY — DX: Cardiac murmur, unspecified: R01.1

## 2017-01-20 LAB — CBC
HEMATOCRIT: 40.8 % (ref 35.0–47.0)
HEMOGLOBIN: 13.9 g/dL (ref 12.0–16.0)
MCH: 30.1 pg (ref 26.0–34.0)
MCHC: 34.1 g/dL (ref 32.0–36.0)
MCV: 88 fL (ref 80.0–100.0)
Platelets: 327 10*3/uL (ref 150–440)
RBC: 4.64 MIL/uL (ref 3.80–5.20)
RDW: 14 % (ref 11.5–14.5)
WBC: 14 10*3/uL — ABNORMAL HIGH (ref 3.6–11.0)

## 2017-01-20 NOTE — ED Triage Notes (Signed)
Patient brought in by PD. Patient was on the way to jail and started complaining of chest pain. Patient here for medical clearance.

## 2017-01-21 LAB — TROPONIN I: Troponin I: <0.03 ng/mL (ref <0.03)

## 2017-01-21 LAB — BASIC METABOLIC PANEL
Anion gap: 10 (ref 5–15)
BUN: 13 mg/dL (ref 6–20)
CHLORIDE: 101 mmol/L (ref 101–111)
CO2: 25 mmol/L (ref 22–32)
CREATININE: 0.66 mg/dL (ref 0.44–1.00)
Calcium: 10.1 mg/dL (ref 8.9–10.3)
GFR calc non Af Amer: >60 mL/min (ref >60)
Glucose, Bld: 107 mg/dL — ABNORMAL HIGH (ref 65–99)
Potassium: 2.9 mmol/L — ABNORMAL LOW (ref 3.5–5.1)
Sodium: 136 mmol/L (ref 135–145)

## 2017-01-21 LAB — ETHANOL

## 2017-01-21 MED ORDER — LORAZEPAM 0.5 MG PO TABS
0.5000 mg | ORAL_TABLET | Freq: Once | ORAL | Status: DC
  Filled 2017-01-21: qty 1

## 2017-01-21 MED ORDER — POTASSIUM CHLORIDE CRYS ER 20 MEQ PO TBCR: 40.0000 meq | EXTENDED_RELEASE_TABLET | Freq: Once | ORAL | Status: DC

## 2017-01-21 NOTE — Discharge Instructions (Signed)
You refused to have your second troponin drawn. She reports that she feels better and is ready to go to jail. Please return with any other problems or concerns. Please follow up

## 2017-01-21 NOTE — ED Notes (Signed)
Patient refusing further treatment and screenings. Patient requesting to be discharged AMA. The dangers of leaving AMA were explained to patient. Pt verbalized understanding. Pt continues to refuse treatment and requesting discharge.

## 2017-01-21 NOTE — ED Notes (Signed)
Pt woken for meds and urine sample, pt refusing further medical assistance att, Dr Zenda AlpersWebster and Deputy Willeen CassBennett notified  [facility]
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
## 2017-01-21 NOTE — ED Notes (Signed)
Reviewed d/c instructions and follow-up care, Pt verbalized understanding. Discussed that patient is leaving against medical advise - reviewed risks of that decision. Patient verbalized understanding. Pt affirmed decision to be discharged AMA. Patient discharged to police custody.

## 2017-02-13 ENCOUNTER — Encounter: Payer: Self-pay | Admitting: Emergency Medicine

## 2017-02-13 ENCOUNTER — Emergency Department
Admission: EM | Admit: 2017-02-13 | Discharge: 2017-02-13 | Disposition: A | Discharge status: Home / Self Care | Payer: Medicaid Other | Attending: Emergency Medicine | Admitting: Emergency Medicine

## 2017-02-13 DIAGNOSIS — R590 Localized enlarged lymph nodes: Secondary | ICD-10-CM | POA: Insufficient documentation

## 2017-02-13 DIAGNOSIS — F17210 Nicotine dependence, cigarettes, uncomplicated: Secondary | ICD-10-CM | POA: Insufficient documentation

## 2017-02-13 DIAGNOSIS — Y999 Unspecified external cause status: Secondary | ICD-10-CM | POA: Insufficient documentation

## 2017-02-13 DIAGNOSIS — Y929 Unspecified place or not applicable: Secondary | ICD-10-CM | POA: Insufficient documentation

## 2017-02-13 DIAGNOSIS — W57XXXA Bitten or stung by nonvenomous insect and other nonvenomous arthropods, initial encounter: Secondary | ICD-10-CM | POA: Insufficient documentation

## 2017-02-13 DIAGNOSIS — Y939 Activity, unspecified: Secondary | ICD-10-CM | POA: Insufficient documentation

## 2017-02-13 DIAGNOSIS — S00461A Insect bite (nonvenomous) of right ear, initial encounter: Secondary | ICD-10-CM | POA: Insufficient documentation

## 2017-02-13 DIAGNOSIS — Z79899 Other long term (current) drug therapy: Secondary | ICD-10-CM | POA: Insufficient documentation

## 2017-02-13 DIAGNOSIS — L988 Other specified disorders of the skin and subcutaneous tissue: Secondary | ICD-10-CM | POA: Insufficient documentation

## 2017-02-13 MED ORDER — NAPROXEN 500 MG PO TABS: 500.0000 mg | ORAL_TABLET | Freq: Two times a day (BID) | ORAL | Status: AC

## 2017-02-13 MED ORDER — FLUCONAZOLE 150 MG PO TABS: 150.0000 mg | ORAL_TABLET | Freq: Every day | ORAL | 0 refills | Status: AC

## 2017-02-13 MED ORDER — SULFAMETHOXAZOLE-TRIMETHOPRIM 800-160 MG PO TABS: 1.0000 | ORAL_TABLET | Freq: Two times a day (BID) | ORAL | 0 refills | Status: AC
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
## 2017-02-13 NOTE — Discharge Instructions (Signed)
Take medication as directed.

## 2017-02-13 NOTE — ED Notes (Signed)
See triage note  States she thinks she was bitten by an insect to back of neck .Natalie Gaines. Has small red area to right side of neck  And now swelling and increased pain to right side of neck

## 2017-02-13 NOTE — ED Triage Notes (Signed)
Patient presents to the ED with red bump behind her right ear x 3 days.  Patient is in no obvious distress at this time.  Patient states she believes bump is a brown recluse bite because she killed a brown recluse spider several days ago.

## 2017-02-13 NOTE — Care Management Note (Signed)
Case Management Note  Patient Details  Name: Gearldine ShownBrandy L Cary MRN: [id number] Date of Birth: [birth year]  Subjective/Objective:   Spoke to patient, who is very eager to share her history. Verified she now has scripts for her insect infection. She states she is able to cover the $4 co-pay . She was also given information for [facility] so that she could follow up with them as an outpatient for her behavioral needs.  Also [facility] in the future if she is unable to afford medications. No questions when asked. Patient to discharge.               Action/Plan:   Expected Discharge Date:                  Expected Discharge Plan:     In-House Referral:     Discharge planning Services     Post Acute Care Choice:    Choice offered to:     DME Arranged:    DME Agency:     HH Arranged:    HH Agency:     Status of Service:     If discussed at [employer] Length of Stay Meetings, dates discussed:    Additional Comments:  Berna BueCheryl Kolleen Ochsner, RN 02/13/2017, 2:02 PM

## 2017-07-22 ENCOUNTER — Ambulatory Visit (INDEPENDENT_AMBULATORY_CARE_PROVIDER_SITE_OTHER): Payer: Self-pay

## 2017-07-22 ENCOUNTER — Ambulatory Visit
Admission: EM | Admit: 2017-07-22 | Discharge: 2017-07-22 | Disposition: A | Discharge status: Home / Self Care | Payer: Self-pay | Attending: Family Medicine | Admitting: Family Medicine

## 2017-07-22 ENCOUNTER — Encounter: Payer: Self-pay | Admitting: *Deleted

## 2017-07-22 DIAGNOSIS — W19XXXA Unspecified fall, initial encounter: Secondary | ICD-10-CM

## 2017-07-22 DIAGNOSIS — S8001XA Contusion of right knee, initial encounter: Secondary | ICD-10-CM

## 2017-07-22 MED ORDER — HYDROCODONE-ACETAMINOPHEN 5-325 MG PO TABS: ORAL_TABLET | ORAL | 0 refills | Status: DC

## 2017-07-22 NOTE — Discharge Instructions (Signed)
Rest, ice, elevation, ibuprofen 600mg  three times daily
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
## 2017-07-22 NOTE — ED Triage Notes (Signed)
Pt felt this morning on her right knee. Pt is having difficult walking and also having pain to right knee

## 2018-11-19 IMAGING — CR DG CHEST 2V
1 series · 2 of 2 positions shown · non-contrast
Comparison: Thoracic spine radiographs performed 12/16/2013

CLINICAL DATA: Acute onset of generalized chest pain and shortness
of breath. Medical clearance. Initial encounter.

EXAM:
CHEST  2 VIEW

[Series 1: dg chest 2 view · 0.14mm/px · 2 of 2 slices shown]
[im 1/2]
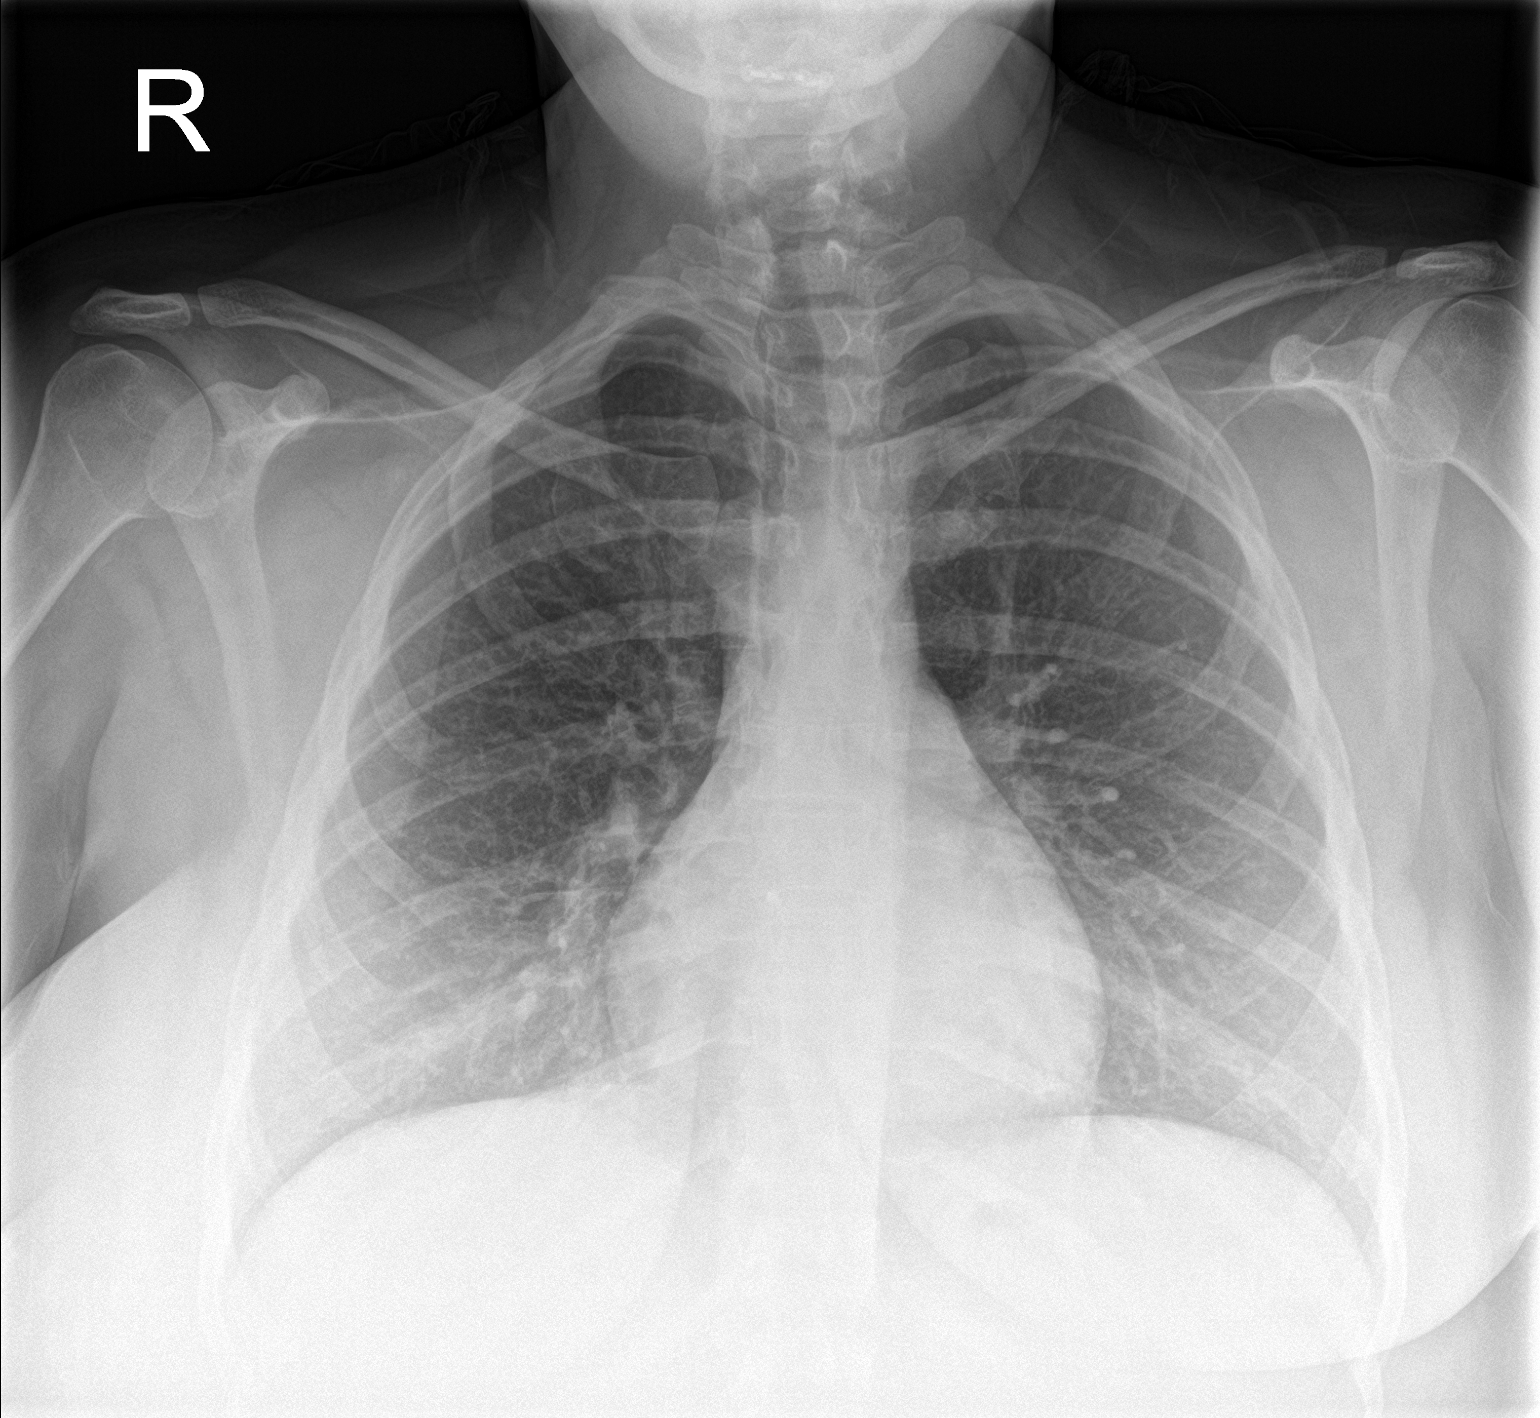
[im 2/2]
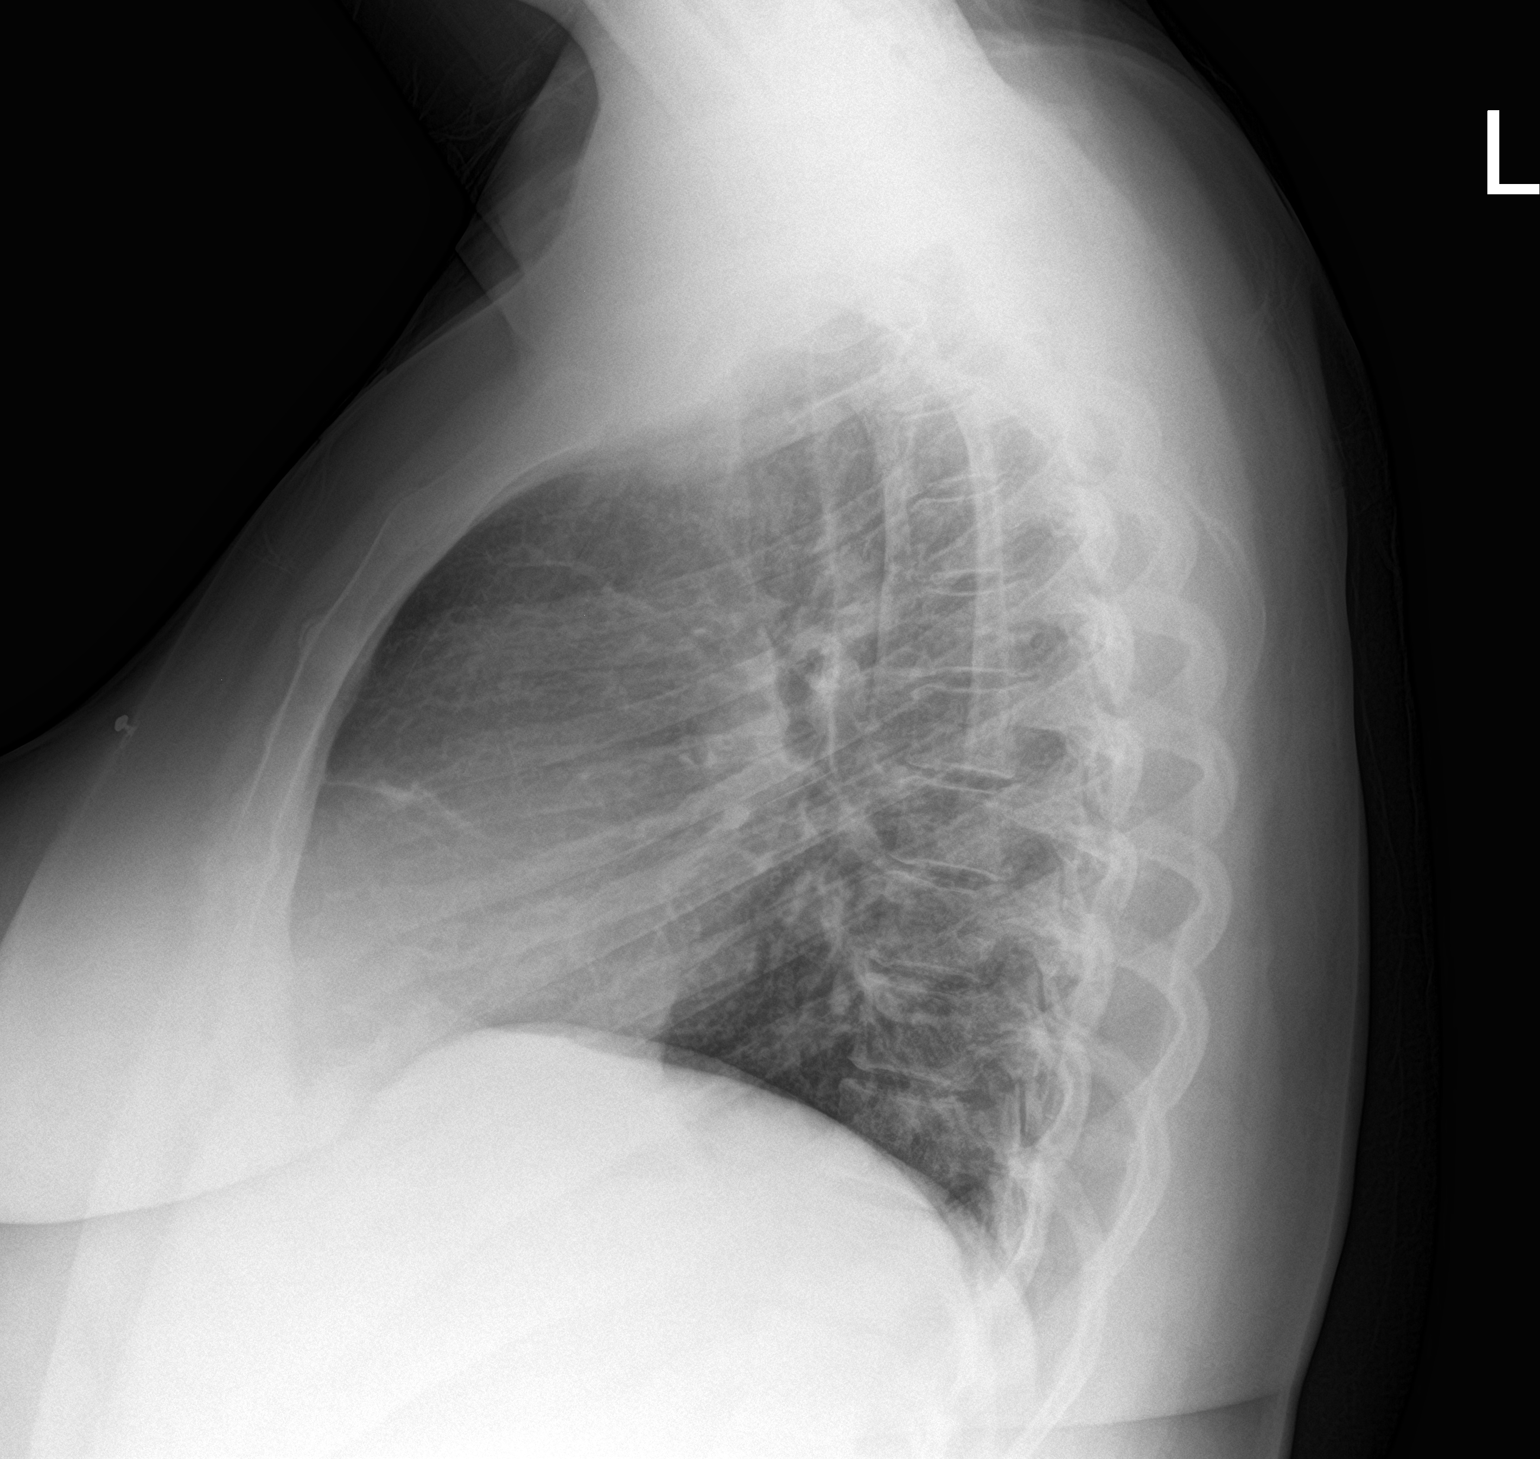

[2 of 2 positions shown; findings below may reference images not displayed]

FINDINGS: The lungs are well-aerated and clear. There is no evidence of focal
opacification, pleural effusion or pneumothorax.

The heart is normal in size; the mediastinal contour is within
normal limits. No acute osseous abnormalities are seen. A metallic
dermal piercing is noted at the mid sternum.
IMPRESSION: No acute cardiopulmonary process seen.

## 2019-03-23 ENCOUNTER — Other Ambulatory Visit: Payer: Self-pay

## 2019-03-23 ENCOUNTER — Emergency Department
Admission: EM | Admit: 2019-03-23 | Discharge: 2019-03-24 | Disposition: A | Discharge status: Other Acute Inpatient Hospital | Payer: Medicaid Other | Attending: Emergency Medicine | Admitting: Emergency Medicine

## 2019-03-23 DIAGNOSIS — Z008 Encounter for other general examination: Secondary | ICD-10-CM | POA: Insufficient documentation

## 2019-03-23 DIAGNOSIS — F17210 Nicotine dependence, cigarettes, uncomplicated: Secondary | ICD-10-CM | POA: Insufficient documentation

## 2019-03-23 DIAGNOSIS — Z79899 Other long term (current) drug therapy: Secondary | ICD-10-CM | POA: Insufficient documentation

## 2019-03-23 DIAGNOSIS — F322 Major depressive disorder, single episode, severe without psychotic features: Secondary | ICD-10-CM | POA: Insufficient documentation

## 2019-03-23 DIAGNOSIS — Z20828 Contact with and (suspected) exposure to other viral communicable diseases: Secondary | ICD-10-CM | POA: Insufficient documentation

## 2019-03-23 DIAGNOSIS — R45851 Suicidal ideations: Secondary | ICD-10-CM | POA: Insufficient documentation

## 2019-03-23 DIAGNOSIS — Z590 Homelessness: Secondary | ICD-10-CM | POA: Insufficient documentation

## 2019-03-23 LAB — CBC
HCT: 44.7 % (ref 36.0–46.0)
Hemoglobin: 14.2 g/dL (ref 12.0–15.0)
MCH: 27.5 pg (ref 26.0–34.0)
MCHC: 31.8 g/dL (ref 30.0–36.0)
MCV: 86.5 fL (ref 80.0–100.0)
Platelets: 282 10*3/uL (ref 150–400)
RBC: 5.17 MIL/uL — ABNORMAL HIGH (ref 3.87–5.11)
RDW: 15.7 % — ABNORMAL HIGH (ref 11.5–15.5)
WBC: 11.9 10*3/uL — ABNORMAL HIGH (ref 4.0–10.5)
nRBC: 0 % (ref 0.0–0.2)

## 2019-03-23 LAB — COMPREHENSIVE METABOLIC PANEL
ALT: 15 U/L (ref 0–44)
AST: 16 U/L (ref 15–41)
Albumin: 4.7 g/dL (ref 3.5–5.0)
Alkaline Phosphatase: 82 U/L (ref 38–126)
Anion gap: 9 (ref 5–15)
BUN: 13 mg/dL (ref 6–20)
CO2: 25 mmol/L (ref 22–32)
Calcium: 9.7 mg/dL (ref 8.9–10.3)
Chloride: 105 mmol/L (ref 98–111)
Creatinine, Ser: 0.65 mg/dL (ref 0.44–1.00)
GFR calc Af Amer: >60 mL/min (ref >60)
GFR calc non Af Amer: >60 mL/min (ref >60)
Glucose, Bld: 101 mg/dL — ABNORMAL HIGH (ref 70–99)
Potassium: 4.1 mmol/L (ref 3.5–5.1)
Sodium: 139 mmol/L (ref 135–145)
Total Bilirubin: 0.5 mg/dL (ref 0.3–1.2)
Total Protein: 8.2 g/dL — ABNORMAL HIGH (ref 6.5–8.1)

## 2019-03-23 LAB — SALICYLATE LEVEL: Salicylate Lvl: <7 mg/dL (ref 2.8–30.0)

## 2019-03-23 LAB — ACETAMINOPHEN LEVEL: Acetaminophen (Tylenol), Serum: <10 ug/mL — ABNORMAL LOW (ref 10–30)

## 2019-03-23 LAB — ETHANOL: Alcohol, Ethyl (B): <10 mg/dL (ref <10)

## 2019-03-23 LAB — SARS CORONAVIRUS 2 BY RT PCR (HOSPITAL ORDER, PERFORMED IN ~~LOC~~ HOSPITAL LAB): SARS Coronavirus 2: NEGATIVE

## 2019-03-23 MED ORDER — HYDROXYZINE HCL 25 MG PO TABS
50.0000 mg | ORAL_TABLET | Freq: Four times a day (QID) | ORAL | Status: DC | PRN
  Administered 2019-03-23: 21:00:00 50 mg via ORAL
  Filled 2019-03-23: qty 2

## 2019-03-23 NOTE — ED Notes (Signed)
Pt ambulatory to the bathroom. Pt given a sanitary pad per request. Pt is calm and appropriate.
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
## 2019-03-23 NOTE — ED Notes (Signed)
Sandals, black and white dress, bookbag, pink cardigan, multiple silver colored jewelry rings, bracelets, shorts, nude underwear, one bra.   Pt refusing to take off the other sports bra.

## 2019-03-23 NOTE — ED Notes (Signed)
Patient has been accepted to [facility].  Accepting physician is Dr. Elaina Hoops.  Call report to [phone].  Representative was [employer].   ER Staff is aware of it:  Carlene ER Secretary  Dr. Jacqualine Code, ER MD  Dondra Spry Patient's Nurse     Patient may arrive to [facility] after 7:00 a.m.

## 2019-03-23 NOTE — ED Notes (Signed)
Pt given meal tray.

## 2019-03-23 NOTE — ED Notes (Signed)
IVC/Consult ordered ?
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
## 2019-03-23 NOTE — ED Notes (Signed)
Hourly rounding reveals patient sleeping in room. No complaints, stable, in no acute distress. Q15 minute rounds and monitoring via Security Cameras to continue. 

## 2019-03-23 NOTE — ED Notes (Signed)
Covid swab done. Report called to [facility]. Unable to remove pt nose ring. Pt given a disposable mask and her mask bagged with her stuff.

## 2019-03-23 NOTE — ED Notes (Signed)
Pt. Transferred to Natalie Gaines from ED to room 3 after screening for contraband. Report to include Situation, Background, Assessment and Recommendations from [facility]. Pt. Oriented to unit including Q15 minute rounds as well as the security cameras for their protection. Patient is alert and oriented, warm and dry in no acute distress. Patient denies SI, HI, and AVH. Pt. Encouraged to let me know if needs arise.

## 2019-03-23 NOTE — ED Notes (Signed)
Hourly rounding reveals patient in room. Stable, in no acute distress. Q15 minute rounds and monitoring via Security Cameras to continue. 

## 2019-03-23 NOTE — ED Notes (Signed)
Pt to the interview room with NP for consult.
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
## 2019-03-23 NOTE — ED Notes (Signed)
No data recorded

## 2019-03-24 NOTE — ED Notes (Signed)
Hourly rounding reveals patient sleeping in room. No complaints, stable, in no acute distress. Q15 minute rounds and monitoring via Security Cameras to continue. 

## 2019-03-24 NOTE — ED Notes (Signed)
ED BHU [place] Is the patient under IVC or is there intent for IVC: Yes.   Is the patient medically cleared: Yes.   Is there vacancy in the ED BHU: Yes.   Is the population mix appropriate for patient: Yes.   Is the patient awaiting placement in inpatient or outpatient setting: Yes.  She will be transferring to inpatient treatment at [facility] this am   Has the patient had a psychiatric consult: Yes.   Survey of unit performed for contraband, proper placement and condition of furniture, tampering with fixtures in bathroom, shower, and each patient room: Yes.  ; Findings:  APPEARANCE/BEHAVIOR Calm and cooperative NEURO ASSESSMENT Orientation: oriented x3  Denies pain Hallucinations: No.None noted (Hallucinations)  denies Speech: Normal Gait: normal RESPIRATORY ASSESSMENT Even  Unlabored respirations  CARDIOVASCULAR ASSESSMENT Pulses equal   regular rate  Skin warm and dry   GASTROINTESTINAL ASSESSMENT no GI complaint EXTREMITIES Full ROM  PLAN OF CARE Provide calm/safe environment. Vital signs assessed twice daily. ED BHU Assessment once each 12-hour shift. Assure the ED provider has rounded once each shift. Provide and encourage hygiene. Provide redirection as needed. Assess for escalating behavior; address immediately and inform ED provider.  Assess family dynamic and appropriateness for visitation as needed: Yes.  ; If necessary, describe findings:  Educate the patient/family about BHU procedures/visitation: Yes.  ; If necessary, describe findings:

## 2019-03-24 NOTE — ED Notes (Signed)
Report received from Gary RN   Patient observed lying in bed with eyes closed  Even, unlabored respirations observed   NAD pt appears to be sleeping  I will continue to monitor along with every 15 minute visual observations and ongoing security camera monitoring    

## 2019-03-24 NOTE — ED Notes (Signed)

## 2019-03-24 NOTE — ED Notes (Signed)
She transferred to [facility] under IVC escorted by [employer] officer

## 2019-03-24 NOTE — ED Notes (Signed)
[place]  [facility]  DEPT  CALLED  FOR  TRANSFER 

## 2020-09-06 ENCOUNTER — Other Ambulatory Visit: Payer: Self-pay

## 2020-09-06 ENCOUNTER — Encounter: Payer: Self-pay | Admitting: Emergency Medicine

## 2020-09-06 ENCOUNTER — Ambulatory Visit
Admission: EM | Admit: 2020-09-06 | Discharge: 2020-09-06 | Disposition: A | Discharge status: Home / Self Care | Payer: Medicaid Other | Attending: Sports Medicine | Admitting: Sports Medicine

## 2020-09-06 DIAGNOSIS — J020 Streptococcal pharyngitis: Secondary | ICD-10-CM | POA: Insufficient documentation

## 2020-09-06 LAB — GROUP A STREP BY PCR: Group A Strep by PCR: DETECTED — AB

## 2020-09-06 MED ORDER — METHYLPREDNISOLONE SODIUM SUCC 125 MG IJ SOLR
125.0000 mg | Freq: Once | INTRAMUSCULAR | Status: AC
  Administered 2020-09-06: 125 mg via INTRAVENOUS

## 2020-09-06 MED ORDER — PENICILLIN G BENZATHINE & PROC 900000-300000 UNIT/2ML IM SUSP
1.2000 10*6.[IU] | Freq: Once | INTRAMUSCULAR | Status: AC
  Administered 2020-09-06: 1.2 10*6.[IU] via INTRAMUSCULAR

## 2020-09-06 MED ORDER — AMOXICILLIN 500 MG PO CAPS: 500.0000 mg | ORAL_CAPSULE | Freq: Two times a day (BID) | ORAL | 0 refills | Status: AC

## 2020-09-06 NOTE — ED Triage Notes (Signed)
Patient c/o sore throat that started 2 days ago.

## 2020-09-06 NOTE — Discharge Instructions (Addendum)
We will treat with amoxicillin 500 twice daily for 10 days. On discharge the patient wanted an intramuscular injection. We will give her penicillin 1.2 million, also Solu-Medrol 125 mg IM. We will provide literature on pharyngitis.  Salt water gargles and plenty of fluids.  Tylenol Motrin for fever discomfort.
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
